# Patient Record
Sex: Female | Born: 2000 | Race: Black or African American | Hispanic: No | Marital: Single
Health system: Southern US, Community
[De-identification: ages and names within clinical notes are randomized; demographics above are authoritative.]

## PROBLEM LIST (undated history)

## (undated) DIAGNOSIS — J189 Pneumonia, unspecified organism: Secondary | ICD-10-CM

## (undated) HISTORY — PX: UMBILICAL HERNIA REPAIR: SHX196

---
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
## 2024-01-23 ENCOUNTER — Other Ambulatory Visit: Payer: Self-pay

## 2024-01-23 ENCOUNTER — Emergency Department (HOSPITAL_COMMUNITY)
Admission: EM | Admit: 2024-01-23 | Discharge: 2024-01-23 | Discharge status: Against Medical Advice | Attending: Emergency Medicine | Admitting: Emergency Medicine

## 2024-01-23 DIAGNOSIS — F419 Anxiety disorder, unspecified: Secondary | ICD-10-CM | POA: Diagnosis present

## 2024-01-23 DIAGNOSIS — Z5321 Procedure and treatment not carried out due to patient leaving prior to being seen by health care provider: Secondary | ICD-10-CM | POA: Diagnosis not present

## 2024-01-23 NOTE — ED Triage Notes (Signed)
 Patient reports anxiety with multiple emotional stressors these past several days requesting psychiatric treatment , denies SI or HI , no hallucinations or delusions .

## 2024-01-23 NOTE — ED Notes (Signed)
 Patient left the ER with family ,  states  blood work will not help her .

## 2024-01-24 ENCOUNTER — Emergency Department (EMERGENCY_DEPARTMENT_HOSPITAL)
Admission: EM | Admit: 2024-01-24 | Discharge: 2024-01-25 | Disposition: A | Discharge status: Other Acute Inpatient Hospital | Source: Home / Self Care | Attending: Emergency Medicine | Admitting: Emergency Medicine

## 2024-01-24 ENCOUNTER — Encounter (HOSPITAL_COMMUNITY): Payer: Self-pay

## 2024-01-24 DIAGNOSIS — F23 Brief psychotic disorder: Secondary | ICD-10-CM

## 2024-01-24 DIAGNOSIS — F29 Unspecified psychosis not due to a substance or known physiological condition: Secondary | ICD-10-CM | POA: Diagnosis present

## 2024-01-24 LAB — CBC
HCT: 39.4 % (ref 36.0–46.0)
Hemoglobin: 12.9 g/dL (ref 12.0–15.0)
MCH: 28.9 pg (ref 26.0–34.0)
MCHC: 32.7 g/dL (ref 30.0–36.0)
MCV: 88.3 fL (ref 80.0–100.0)
Platelets: 296 K/uL (ref 150–400)
RBC: 4.46 MIL/uL (ref 3.87–5.11)
RDW: 15 % (ref 11.5–15.5)
WBC: 5.4 K/uL (ref 4.0–10.5)
nRBC: 0 % (ref 0.0–0.2)

## 2024-01-24 MED ORDER — MIDAZOLAM HCL 2 MG/2ML IJ SOLN
2.0000 mg | Freq: Once | INTRAMUSCULAR | Status: AC
  Administered 2024-01-24: 2 mg via INTRAMUSCULAR
  Filled 2024-01-24: qty 2

## 2024-01-24 MED ORDER — OLANZAPINE 10 MG IM SOLR
5.0000 mg | Freq: Once | INTRAMUSCULAR | Status: AC
  Administered 2024-01-24: 5 mg via INTRAMUSCULAR
  Filled 2024-01-24 (×2): qty 10

## 2024-01-24 NOTE — ED Notes (Signed)
 The person with the pt reports that she is getting harder to handle and that she is saying that she is going to kill herself  we cannot draw her blood she is not co-operating  she is trying to get out the door

## 2024-01-24 NOTE — ED Notes (Signed)
 Pt family out in ED lobby yelling and cussing at security. Pt's mother came out to the lobby to speak with security and asked them to not allow any more visitors back to Ed treatment room. Family members in lobby are starting to grow increasingly irritable.
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
## 2024-01-24 NOTE — ED Notes (Signed)
IVC PAPERWORK IN PROCESS 

## 2024-01-24 NOTE — ED Notes (Signed)
 IVC PAPERWORK COMPLETE WAITING ON OFFICER TO BRING FINDINGS AND CUSTODY

## 2024-01-24 NOTE — ED Notes (Signed)
 Findings and Custody uploaded to efile.

## 2024-01-24 NOTE — ED Notes (Signed)
 IVC PAPER WORK COPIES MADE AND ATTACHED TO THE CLIP BOARD IN ORANGE ZONE. ONCOMING SECRETARY IS AWARE THAT THE FINDING AND CUSTODY  NEEDS TO BE UPLOADED  TO E-FILE I UPLOADED IT TO THE CHART

## 2024-01-24 NOTE — ED Notes (Signed)
 The pt  is not answering questions asked a female friend reports that he is the only one that can  be with her  she is attempting to walk out of the treatment room .  He keeps here from leaving he reports that her family left for the night.  He called the family to get the pts ss number for the ed chart

## 2024-01-24 NOTE — ED Notes (Signed)
 IVC CASE # [id number]

## 2024-01-25 ENCOUNTER — Encounter (HOSPITAL_COMMUNITY): Payer: Self-pay | Admitting: Psychiatry

## 2024-01-25 ENCOUNTER — Inpatient Hospital Stay (HOSPITAL_COMMUNITY)
Admission: AD | Admit: 2024-01-25 | Discharge: 2024-01-31 | DRG: 897 | Disposition: A | Discharge status: Home / Self Care | Source: Intra-hospital | Attending: Student in an Organized Health Care Education/Training Program | Admitting: Student in an Organized Health Care Education/Training Program

## 2024-01-25 ENCOUNTER — Emergency Department (HOSPITAL_COMMUNITY)

## 2024-01-25 DIAGNOSIS — Z7722 Contact with and (suspected) exposure to environmental tobacco smoke (acute) (chronic): Secondary | ICD-10-CM | POA: Diagnosis present

## 2024-01-25 DIAGNOSIS — F29 Unspecified psychosis not due to a substance or known physiological condition: Secondary | ICD-10-CM | POA: Diagnosis not present

## 2024-01-25 DIAGNOSIS — R4182 Altered mental status, unspecified: Secondary | ICD-10-CM | POA: Diagnosis present

## 2024-01-25 DIAGNOSIS — Z880 Allergy status to penicillin: Secondary | ICD-10-CM | POA: Diagnosis not present

## 2024-01-25 DIAGNOSIS — Z5982 Transportation insecurity: Secondary | ICD-10-CM | POA: Diagnosis not present

## 2024-01-25 DIAGNOSIS — F12959 Cannabis use, unspecified with psychotic disorder, unspecified: Secondary | ICD-10-CM | POA: Diagnosis present

## 2024-01-25 DIAGNOSIS — Z818 Family history of other mental and behavioral disorders: Secondary | ICD-10-CM

## 2024-01-25 DIAGNOSIS — F419 Anxiety disorder, unspecified: Secondary | ICD-10-CM | POA: Diagnosis present

## 2024-01-25 DIAGNOSIS — F19959 Other psychoactive substance use, unspecified with psychoactive substance-induced psychotic disorder, unspecified: Principal | ICD-10-CM

## 2024-01-25 DIAGNOSIS — Z62810 Personal history of physical and sexual abuse in childhood: Secondary | ICD-10-CM

## 2024-01-25 DIAGNOSIS — Z5941 Food insecurity: Secondary | ICD-10-CM | POA: Diagnosis not present

## 2024-01-25 DIAGNOSIS — Z560 Unemployment, unspecified: Secondary | ICD-10-CM | POA: Diagnosis not present

## 2024-01-25 DIAGNOSIS — F061 Catatonic disorder due to known physiological condition: Secondary | ICD-10-CM | POA: Diagnosis not present

## 2024-01-25 DIAGNOSIS — Z79899 Other long term (current) drug therapy: Secondary | ICD-10-CM | POA: Diagnosis not present

## 2024-01-25 DIAGNOSIS — F32A Depression, unspecified: Secondary | ICD-10-CM | POA: Diagnosis present

## 2024-01-25 DIAGNOSIS — F4310 Post-traumatic stress disorder, unspecified: Secondary | ICD-10-CM | POA: Diagnosis present

## 2024-01-25 LAB — COMPREHENSIVE METABOLIC PANEL WITH GFR
ALT: 15 U/L (ref 0–44)
AST: 36 U/L (ref 15–41)
Albumin: 4 g/dL (ref 3.5–5.0)
Alkaline Phosphatase: 38 U/L (ref 38–126)
Anion gap: 15 (ref 5–15)
BUN: 12 mg/dL (ref 6–20)
CO2: 20 mmol/L — ABNORMAL LOW (ref 22–32)
Calcium: 9.3 mg/dL (ref 8.9–10.3)
Chloride: 105 mmol/L (ref 98–111)
Creatinine, Ser: 1.16 mg/dL — ABNORMAL HIGH (ref 0.44–1.00)
GFR, Estimated: >60 mL/min (ref >60)
Glucose, Bld: 95 mg/dL (ref 70–99)
Potassium: 3.2 mmol/L — ABNORMAL LOW (ref 3.5–5.1)
Sodium: 140 mmol/L (ref 135–145)
Total Bilirubin: 0.8 mg/dL (ref 0.0–1.2)
Total Protein: 7.6 g/dL (ref 6.5–8.1)

## 2024-01-25 LAB — ACETAMINOPHEN LEVEL: Acetaminophen (Tylenol), Serum: <10 ug/mL — ABNORMAL LOW (ref 10–30)

## 2024-01-25 LAB — URINALYSIS, COMPLETE (UACMP) WITH MICROSCOPIC
Bilirubin Urine: NEGATIVE
Glucose, UA: NEGATIVE mg/dL
Hgb urine dipstick: NEGATIVE
Ketones, ur: 20 mg/dL — AB
Leukocytes,Ua: NEGATIVE
Nitrite: NEGATIVE
Protein, ur: 100 mg/dL — AB
Specific Gravity, Urine: 1.027 (ref 1.005–1.030)
pH: 5 (ref 5.0–8.0)

## 2024-01-25 LAB — HCG, SERUM, QUALITATIVE: Preg, Serum: NEGATIVE

## 2024-01-25 LAB — RAPID URINE DRUG SCREEN, HOSP PERFORMED
Amphetamines: NOT DETECTED
Barbiturates: NOT DETECTED
Benzodiazepines: POSITIVE — AB
Cocaine: NOT DETECTED
Opiates: NOT DETECTED
Tetrahydrocannabinol: POSITIVE — AB

## 2024-01-25 LAB — SALICYLATE LEVEL: Salicylate Lvl: <7 mg/dL — ABNORMAL LOW (ref 7.0–30.0)

## 2024-01-25 LAB — ETHANOL: Alcohol, Ethyl (B): <15 mg/dL (ref <15)

## 2024-01-25 LAB — TSH: TSH: 0.792 u[IU]/mL (ref 0.350–4.500)

## 2024-01-25 MED ORDER — POTASSIUM CHLORIDE CRYS ER 20 MEQ PO TBCR
40.0000 meq | EXTENDED_RELEASE_TABLET | Freq: Once | ORAL | Status: AC
  Administered 2024-01-25: 40 meq via ORAL
  Filled 2024-01-25: qty 2

## 2024-01-25 MED ORDER — HALOPERIDOL 5 MG PO TABS
5.0000 mg | ORAL_TABLET | Freq: Three times a day (TID) | ORAL | Status: DC | PRN
  Administered 2024-01-27: 5 mg via ORAL
  Filled 2024-01-25: qty 1

## 2024-01-25 MED ORDER — DIPHENHYDRAMINE HCL 25 MG PO CAPS
50.0000 mg | ORAL_CAPSULE | Freq: Three times a day (TID) | ORAL | Status: DC | PRN
  Administered 2024-01-27: 50 mg via ORAL
  Filled 2024-01-25: qty 2

## 2024-01-25 MED ORDER — RISPERIDONE 1 MG PO TABS
1.0000 mg | ORAL_TABLET | Freq: Every day | ORAL | Status: DC
  Administered 2024-01-25: 1 mg via ORAL
  Filled 2024-01-25: qty 1

## 2024-01-25 MED ORDER — ALUM & MAG HYDROXIDE-SIMETH 200-200-20 MG/5ML PO SUSP: 30.0000 mL | ORAL | Status: DC | PRN

## 2024-01-25 MED ORDER — RISPERIDONE 1 MG PO TABS
1.0000 mg | ORAL_TABLET | Freq: Every day | ORAL | Status: DC
  Administered 2024-01-26: 1 mg via ORAL
  Filled 2024-01-25: qty 1

## 2024-01-25 MED ORDER — HALOPERIDOL LACTATE 5 MG/ML IJ SOLN: 5.0000 mg | Freq: Three times a day (TID) | INTRAMUSCULAR | Status: DC | PRN

## 2024-01-25 MED ORDER — LORAZEPAM 2 MG/ML IJ SOLN: 2.0000 mg | Freq: Three times a day (TID) | INTRAMUSCULAR | Status: DC | PRN

## 2024-01-25 MED ORDER — MAGNESIUM HYDROXIDE 400 MG/5ML PO SUSP: 30.0000 mL | Freq: Every day | ORAL | Status: DC | PRN

## 2024-01-25 MED ORDER — DIPHENHYDRAMINE HCL 50 MG/ML IJ SOLN: 50.0000 mg | Freq: Three times a day (TID) | INTRAMUSCULAR | Status: DC | PRN

## 2024-01-25 MED ORDER — HALOPERIDOL LACTATE 5 MG/ML IJ SOLN: 10.0000 mg | Freq: Three times a day (TID) | INTRAMUSCULAR | Status: DC | PRN

## 2024-01-25 MED ORDER — ACETAMINOPHEN 325 MG PO TABS
650.0000 mg | ORAL_TABLET | Freq: Four times a day (QID) | ORAL | Status: DC | PRN
  Administered 2024-01-29 (×2): 650 mg via ORAL
  Filled 2024-01-25: qty 2

## 2024-01-25 NOTE — ED Notes (Signed)
 Non emergency [employer] notified to transport patient to [facility].

## 2024-01-25 NOTE — ED Notes (Signed)
 The pts family is still here I just found out they are in the waiting room

## 2024-01-25 NOTE — Progress Notes (Signed)
 Pt has been accepted to [facility] on 01/25/2024. Bed assignment: 506-01  Pt meets inpatient criteria per: Jerel Gravely NP  Attending Physician will be: Dr. Zouev   Report can be called to: unit: Adult unit: [phone]  Pt can arrive after 8 PM tonight   Care Team Notified: [facility] McNichol RN , Jerel Gravely NP, Lonni Mau RN  [place] Jewell Ryans LCSW-A   01/25/2024 1:48 PM

## 2024-01-25 NOTE — ED Notes (Signed)
Attempted to call [facility] no answer

## 2024-01-25 NOTE — ED Notes (Signed)
 The mother called to obtain the patient's blood test results.  I spoke with the RN on duty and he stated that the patient is requesting that  none of her information be shared with anyone. There is also a block on her patient portal.  The mother stated that she was coming to the Emergency Department. Security was notified that the patient has requested that no information be shared concerning her.  The RN on duty was notified that security has been contacted and given the request of the patient not to have her information shared.

## 2024-01-25 NOTE — ED Notes (Signed)
 PT requested family not be informed of transfer or medical updates. Asked for preferred contact to be friend [name] in chart.

## 2024-01-25 NOTE — ED Notes (Signed)
 Patient very tearful saying she do not know what's going on. Patient attempting to tell me that she been living with her uncle since July and she have not felt right since. Patient also stated she smoke marijuana prior to coming to the hospital and she felt like it was laced with something.

## 2024-01-25 NOTE — BH Assessment (Signed)
 Clinician spoke to Faith with IR[facility] to complete pt's TTS assessment. Clinician provided pt's name, MRN, location, age, room number and provider's name Secure message completed.    Iris coordinator to update secure chat when assessment time and provider are assigned.   Redmond Pulling, MS, [facility], [facility] Triage Specialist [phone]

## 2024-01-25 NOTE — ED Notes (Signed)
 Case Number: [id number] 3 copies of the IVC were verified in the purple zone.  The patient was IVC'd on 01/24/2024 and the paperwork expires on 01/31/2024.

## 2024-01-25 NOTE — ED Notes (Signed)
 The pts family residing in the waiting room  the pt appears to be sleeping

## 2024-01-25 NOTE — ED Notes (Signed)
`  the pt was brought back to room 6 not triaged family members  all over the place  apparently the pt was seen here last pm and had psy issues.  But the family took her home.  Tonight she has si but unco-operative

## 2024-01-25 NOTE — ED Notes (Signed)
 IVC is current

## 2024-01-25 NOTE — BH Assessment (Signed)
 Clinician spoke to with [facility] to complete pt's TTS assessment. Clinician provided pt's name, MRN, location, age, room number and provider's name. Secure message completed.   Iris coordinator to update secure chat when assessment time and provider are assigned.

## 2024-01-25 NOTE — ED Provider Notes (Addendum)
 Emergency Medicine Observation Re-evaluation Note  Joyce Cruz is a 23 y.o. female, seen on rounds today.  Pt initially presented to the ED for complaints of Psychiatric Evaluation Currently, the patient is sleeping.  Physical Exam  BP 117/84 (BP Location: Left Arm)   Pulse 67   Temp 97.9 F (36.6 C) (Oral)   Resp 18   SpO2 99%  Physical Exam General: resting Cardiac: regular rate Lungs: normal effort   ED Course / MDM  EKG:   I have reviewed the labs performed to date as well as medications administered while in observation.  Recent changes in the last 24 hours include initial evaluation, pt arrived in ed approx 11 hours ago.  Plan  Pt has been medically cleared.  Waiting for psych evaluation.SABRA Randol Simmonds, MD 01/25/24 [id number]  Head CT was added on as requested.  No intracranial abnormality noted.  Patient does have sinus opacification.  Would not account for ms changes.  Pt medically clear   Randol Simmonds, MD 01/25/24 1100    Randol Simmonds, MD 01/25/24 1352

## 2024-01-25 NOTE — ED Notes (Signed)
 PT transported to CT with sitter

## 2024-01-25 NOTE — BH Assessment (Signed)
 Monique, RN reports, she feels the pt may benefit from waiting til after shift change to be assessed (face to face). It has been difficult to get the pt to relax and rest. Pt has been calm and sleeping for about an hour.  Pt's consult was removed from [facility].   Jackson JONETTA Broach, MS, [facility], [facility] Triage Specialist [phone]

## 2024-01-25 NOTE — ED Notes (Signed)
 Attempted to call report per secretary patient nurse is dealing with another situation and would call me back. Left name and number with Diplomatic Services operational officer.
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
## 2024-01-25 NOTE — ED Notes (Signed)
 Pt changed into paper scrubs, all items on person inventoried and placed in locker 2.
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
## 2024-01-25 NOTE — ED Notes (Signed)
 Patient transported to [facility] via [employer]. Patient alert and oriented x4.

## 2024-01-25 NOTE — ED Notes (Signed)
Patient took shower.

## 2024-01-25 NOTE — ED Notes (Signed)
 PT has visitor, screened by security and PT talking in room with sitter present

## 2024-01-25 NOTE — ED Notes (Signed)
 IVC PAPERWORK IS CURRENT

## 2024-01-25 NOTE — Tx Team (Addendum)
 Initial Treatment Plan 01/25/2024 11:31 PM LONITA DEBES FMW:[id number]    PATIENT STRESSORS: Marital or family conflict   Occupational concerns   Substance abuse     PATIENT STRENGTHS: General fund of knowledge  Motivation for treatment/growth    PATIENT IDENTIFIED PROBLEMS: Psychosis  Disorganized thought process  I want to know what is going on, there's something you're not telling me  SA- BZO, THC  SI              DISCHARGE CRITERIA:  Adequate post-discharge living arrangements Improved stabilization in mood, thinking, and/or behavior Verbal commitment to aftercare and medication compliance  PRELIMINARY DISCHARGE PLAN: Attend PHP/IOP Attend 12-step recovery group Placement in alternative living arrangements  PATIENT/FAMILY INVOLVEMENT: This treatment plan has been presented to and reviewed with the patient, Joyce Cruz.  The patient and family have been given the opportunity to ask questions and make suggestions.  Orlando Ozell LABOR, RN 01/25/2024, 11:31 PM
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
## 2024-01-26 ENCOUNTER — Encounter (HOSPITAL_COMMUNITY): Payer: Self-pay

## 2024-01-26 ENCOUNTER — Other Ambulatory Visit: Payer: Self-pay

## 2024-01-26 ENCOUNTER — Encounter (HOSPITAL_COMMUNITY): Payer: Self-pay | Admitting: Psychiatry

## 2024-01-26 LAB — LIPID PANEL
Cholesterol: 251 mg/dL — ABNORMAL HIGH (ref 0–200)
HDL: 96 mg/dL (ref >40)
LDL Cholesterol: 145 mg/dL — ABNORMAL HIGH (ref 0–99)
Total CHOL/HDL Ratio: 2.6 ratio
Triglycerides: 49 mg/dL (ref <150)
VLDL: 10 mg/dL (ref 0–40)

## 2024-01-26 LAB — HEMOGLOBIN A1C
Hgb A1c MFr Bld: 5.2 % (ref 4.8–5.6)
Mean Plasma Glucose: 103 mg/dL

## 2024-01-26 MED ORDER — TRAZODONE HCL 50 MG PO TABS
50.0000 mg | ORAL_TABLET | Freq: Every evening | ORAL | Status: DC | PRN
  Administered 2024-01-26 – 2024-01-30 (×7): 50 mg via ORAL
  Filled 2024-01-26 (×5): qty 1

## 2024-01-26 MED ORDER — HYDROXYZINE HCL 25 MG PO TABS
25.0000 mg | ORAL_TABLET | Freq: Three times a day (TID) | ORAL | Status: AC | PRN
  Administered 2024-01-26: 25 mg via ORAL
  Filled 2024-01-26: qty 1

## 2024-01-26 MED ORDER — RISPERIDONE 1 MG PO TABS
1.0000 mg | ORAL_TABLET | Freq: Two times a day (BID) | ORAL | Status: DC
  Administered 2024-01-26 – 2024-01-27 (×2): 1 mg via ORAL
  Filled 2024-01-26 (×2): qty 1

## 2024-01-26 NOTE — Plan of Care (Signed)
  Problem: Education: Goal: Emotional status will improve Outcome: Not Progressing Goal: Mental status will improve Outcome: Not Progressing    Dar  Note: Patient presents flat, anxious, guarded with depressed mood.  Preoccupied with getting discharge.  Patient appears tearful and sad.  Doesn't know why she's here and demanding to see her provider.  Denies suicidal thoughts, auditory and visual hallucinations.  Medication given as prescribed.  Routine safety checks maintained.  Offered support and encouragement as needed.  Staff continues to explain to patient treatment plan of care but lacks understanding or insight.  Patient is safe on the unit.

## 2024-01-26 NOTE — Progress Notes (Signed)
(  Sleep Hours) -8.25  (Any PRNs that were needed, meds refused, or side effects to meds)- Prn Vistaril  25 mg, Trazodone  50 mg  (Any disturbances and when (visitation, over night)- none  (Concerns raised by the patient)- pt continues to wonder why she is here, pt continues to be confused at times  (SI/HI/AVH)- denies

## 2024-01-26 NOTE — Group Note (Signed)
 Recreation Therapy Group Note   Group Topic:Problem Solving  Group Date: 01/26/2024 Start Time: 1015 End Time: 1045 Facilitators: Madline Oesterling-McCall, LRT,CTRS Location: [address] Dayroom   Group Topic: Problem Solving  Goal Area(s) Addresses:  Patient will identify positive ways to complete puzzles presented.  Patient will work effectively with peer to solve the puzzles presented in group.  Behavioral Response:    Intervention: Group Work, Problem Solving  Activity: In pairs, patients were asked to create a game with their teammate. Team's were tasked with designing a game, including a Name, Description of Game, Equipment/Supplies, Rules, and Number of players needed. Teach back used to present their game idea to the group.  Education:  Leisure Scientist, physiological, Special educational needs teacher, Teamwork, Discharge Planning  Education Outcome: Acknowledges education/In group clarification offered/Needs additional education.    Affect/Mood: Labile   Participation Level: None   Participation Quality: None   Behavior: Guarded   Speech/Thought Process: Barely audible    Insight: None   Judgement: Nonoe   Modes of Intervention: Worksheet   Patient Response to Interventions:  None   Education Outcome:  In group clarification offered    Clinical Observations/Individualized Feedback: Pt would come into group and walk up on LRT asking to talk. LRT informed pt that LRT was in the middle of group and would have to talk to her after. Pt left but would later return.     Plan: Continue to engage patient in RT group sessions 2-3x/week.   Galvin Aversa-McCall, LRT,CTRS 01/26/2024 11:33 AM
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
## 2024-01-26 NOTE — Plan of Care (Signed)
  Problem: Activity: Goal: Sleeping patterns will improve Outcome: Progressing   Problem: Safety: Goal: Periods of time without injury will increase Outcome: Progressing   Problem: Education: Goal: Emotional status will improve Outcome: Not Progressing Goal: Mental status will improve Outcome: Not Progressing   Problem: Education: Goal: Mental status will improve Outcome: Not Progressing
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
## 2024-01-26 NOTE — [facility] Group Notes (Addendum)
 Spirituality Group   Description: Participant directed exploration of values, beliefs and meaning   Following a brief framework of chaplain's role and ground rules of group behavior, participants are invited to share concerns or questions that engage spiritual life. Emphasis placed on common themes and shared experiences and ways to make meaning and clarify living into one's values.   Theory/Process/Goal: Utilize the theoretical framework of group therapy established by Celena Kite, Relational Cultural Theory and Rogerian approaches to facilitate relational empathy and use of the "here and now" to foster reflection, self-awareness, and sharing.   Observations: Joyce Cruz was present for the second half of group. She was an active participant in the group discussion. She was rather emotional, sharing with openness around concerns with family relationships. This invited group discussion around healthy boundaries and sometimes locating chosen family, people we can trust, ways to look elsewhere for supportive relationships.  Joyce Cruz, M.Div [phone]

## 2024-01-26 NOTE — Plan of Care (Signed)
  Problem: Activity: Goal: Sleeping patterns will improve Outcome: Progressing   Problem: Safety: Goal: Periods of time without injury will increase Outcome: Progressing   Problem: Education: Goal: Emotional status will improve Outcome: Not Progressing Goal: Mental status will improve Outcome: Not Progressing

## 2024-01-26 NOTE — Group Note (Signed)
 Date:  01/26/2024 Time:  8:44 PM  Group Topic/Focus:  Wrap-Up Group:   The focus of this group is to help patients review their daily goal of treatment and discuss progress on daily workbooks.    Participation Level:  Active  Participation Quality:  Appropriate and Sharing  Affect:  Appropriate and Flat  Cognitive:  Appropriate  Insight: Appropriate and Limited  Engagement in Group:  Engaged  Modes of Intervention:  Discussion and Socialization  Additional Comments:  Patient shared that she is doing fine and she had an okay day. Patient stated that she is trying to figure out why she is still here. Patient rated her day a 5 out of 10.  Eward Mace 01/26/2024, 8:44 PM

## 2024-01-26 NOTE — Progress Notes (Signed)
(  Sleep Hours) - 5  (Any PRNs that were needed, meds refused, or side effects to meds)- N/A  (Any disturbances and when (visitation, over night)- N/A  (Concerns raised by the patient)- why am I here  (SI/HI/AVH)- denies

## 2024-01-26 NOTE — Progress Notes (Signed)
   01/26/24 1100  Psych Admission Type (Psych Patients Only)  Admission Status Involuntary  Psychosocial Assessment  Patient Complaints Sadness;Depression  Eye Contact Fair  Facial Expression Sad  Affect Preoccupied;Sad  Speech Slow  Interaction Cautious  Motor Activity Slow  Appearance/Hygiene In scrubs  Behavior Characteristics Guarded  Mood Preoccupied  Thought Process  Coherency Blocking;Circumstantial  Content Preoccupation  Delusions Paranoid  Perception Derealization  Hallucination None reported or observed  Judgment Impaired  Confusion None  Danger to Self  Current suicidal ideation? Denies  Danger to Others  Danger to Others None reported or observed
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
## 2024-01-26 NOTE — BHH Group Notes (Signed)
 Adult Psychoeducational Group Note  Date:  01/26/2024 Time:  9:13 AM  Group Topic/Focus:  Goals Group:   The focus of this group is to help patients establish daily goals to achieve during treatment and discuss how the patient can incorporate goal setting into their daily lives to aide in recovery. Orientation:   The focus of this group is to educate the patient on the purpose and policies of crisis stabilization and provide a format to answer questions about their admission.  The group details unit policies and expectations of patients while admitted.  Participation Level:  Active  Participation Quality:  Appropriate  Affect:  Appropriate  Cognitive:  Appropriate  Insight: Appropriate  Engagement in Group:  Engaged  Modes of Intervention:  Discussion  Additional Comments:  Pt attended the goals group and remained appropriate and engaged throughout the duration of the group.   Kapena Hamme O 01/26/2024, 9:13 AM

## 2024-01-26 NOTE — BH IP Treatment Plan (Signed)
 Interdisciplinary Treatment and Diagnostic Plan Update  01/26/2024 Time of Session: 11:15 AM Joyce Cruz MRN: [id number]  Principal Diagnosis: Psychosis, unspecified psychosis type (HCC)  Secondary Diagnoses: Principal Problem:   Psychosis, unspecified psychosis type (HCC)   Current Medications:  Current Facility-Administered Medications  Medication Dose Route Frequency Provider Last Rate Last Admin   acetaminophen  (TYLENOL ) tablet 650 mg  650 mg Oral Q6H PRN Mannie Jerel PARAS, NP       alum & mag hydroxide-simeth (MAALOX/MYLANTA) 200-200-20 MG/5ML suspension 30 mL  30 mL Oral Q4H PRN Mannie Jerel PARAS, NP       haloperidol  (HALDOL ) tablet 5 mg  5 mg Oral TID PRN Mannie Jerel PARAS, NP       And   diphenhydrAMINE  (BENADRYL ) capsule 50 mg  50 mg Oral TID PRN Mannie Jerel PARAS, NP       haloperidol  lactate (HALDOL ) injection 5 mg  5 mg Intramuscular TID PRN Mannie Jerel PARAS, NP       And   diphenhydrAMINE  (BENADRYL ) injection 50 mg  50 mg Intramuscular TID PRN Mannie Jerel PARAS, NP       And   LORazepam  (ATIVAN ) injection 2 mg  2 mg Intramuscular TID PRN Mannie Jerel PARAS, NP       haloperidol  lactate (HALDOL ) injection 10 mg  10 mg Intramuscular TID PRN Mannie Jerel PARAS, NP       And   diphenhydrAMINE  (BENADRYL ) injection 50 mg  50 mg Intramuscular TID PRN Mannie Jerel PARAS, NP       And   LORazepam  (ATIVAN ) injection 2 mg  2 mg Intramuscular TID PRN Mannie Jerel PARAS, NP       magnesium  hydroxide (MILK OF MAGNESIA) suspension 30 mL  30 mL Oral Daily PRN Mannie Jerel PARAS, NP       risperiDONE  (RISPERDAL ) tablet 1 mg  1 mg Oral Q12H Zouev, Dmitri, MD       PTA Medications: No medications prior to admission.    Patient Stressors: Marital or family conflict   Occupational concerns   Substance abuse    Patient Strengths: Automotive engineer for treatment/growth   Treatment Modalities: Medication Management, Group therapy, Case management,  1 to 1 session with  clinician, Psychoeducation, Recreational therapy.   Physician Treatment Plan for Primary Diagnosis: Psychosis, unspecified psychosis type (HCC) Long Term Goal(s): Improvement in symptoms so as ready for discharge   Short Term Goals: Ability to verbalize feelings will improve Ability to identify and develop effective coping behaviors will improve Ability to maintain clinical measurements within normal limits will improve Ability to identify triggers associated with substance abuse/mental health issues will improve  Medication Management: Evaluate patient's response, side effects, and tolerance of medication regimen.  Therapeutic Interventions: 1 to 1 sessions, Unit Group sessions and Medication administration.  Evaluation of Outcomes: Not Progressing  Physician Treatment Plan for Secondary Diagnosis: Principal Problem:   Psychosis, unspecified psychosis type (HCC)  Long Term Goal(s): Improvement in symptoms so as ready for discharge   Short Term Goals: Ability to verbalize feelings will improve Ability to identify and develop effective coping behaviors will improve Ability to maintain clinical measurements within normal limits will improve Ability to identify triggers associated with substance abuse/mental health issues will improve     Medication Management: Evaluate patient's response, side effects, and tolerance of medication regimen.  Therapeutic Interventions: 1 to 1 sessions, Unit Group sessions and Medication administration.  Evaluation of Outcomes: Not Progressing   RN Treatment  Plan for Primary Diagnosis: Psychosis, unspecified psychosis type (HCC) Long Term Goal(s): Knowledge of disease and therapeutic regimen to maintain health will improve  Short Term Goals: Ability to remain free from injury will improve, Ability to verbalize frustration and anger appropriately will improve, Ability to demonstrate self-control, Ability to participate in decision making will improve,  Ability to verbalize feelings will improve, Ability to disclose and discuss suicidal ideas, Ability to identify and develop effective coping behaviors will improve, and Compliance with prescribed medications will improve  Medication Management: RN will administer medications as ordered by provider, will assess and evaluate patient's response and provide education to patient for prescribed medication. RN will report any adverse and/or side effects to prescribing provider.  Therapeutic Interventions: 1 on 1 counseling sessions, Psychoeducation, Medication administration, Evaluate responses to treatment, Monitor vital signs and CBGs as ordered, Perform/monitor CIWA, COWS, AIMS and Fall Risk screenings as ordered, Perform wound care treatments as ordered.  Evaluation of Outcomes: Not Progressing   LCSW Treatment Plan for Primary Diagnosis: Psychosis, unspecified psychosis type (HCC) Long Term Goal(s): Safe transition to appropriate next level of care at discharge, Engage patient in therapeutic group addressing interpersonal concerns.  Short Term Goals: Engage patient in aftercare planning with referrals and resources, Increase social support, Increase ability to appropriately verbalize feelings, Increase emotional regulation, Facilitate acceptance of mental health diagnosis and concerns, Facilitate patient progression through stages of change regarding substance use diagnoses and concerns, Identify triggers associated with mental health/substance abuse issues, and Increase skills for wellness and recovery  Therapeutic Interventions: Assess for all discharge needs, 1 to 1 time with Social worker, Explore available resources and support systems, Assess for adequacy in community support network, Educate family and significant other(s) on suicide prevention, Complete Psychosocial Assessment, Interpersonal group therapy.  Evaluation of Outcomes: Not Progressing   Progress in Treatment: Attending groups:  Yes. Participating in groups: Yes. Taking medication as prescribed: Yes. Toleration medication: Yes. Family/Significant other contact made: No, will contact:  [name] (ex-boyfriend) [phone] Patient understands diagnosis: No. Discussing patient identified problems/goals with staff: No. Medical problems stabilized or resolved: Yes. Denies suicidal/homicidal ideation: Yes. Issues/concerns per patient self-inventory: No.   New problem(s) identified:  No  New Short Term/Long Term Goal(s):    medication stabilization, elimination of SI thoughts, development of comprehensive mental wellness plan.    Patient Goals:  I smoked with my cousin but I'm feeling fine now.  I want to leave the hospital.  Discharge Plan or Barriers:  Patient recently admitted. CSW will continue to follow and assess for appropriate referrals and possible discharge planning.   Reason for Continuation of Hospitalization: Medication stabilization Psychosis  Estimated Length of Stay:  5 - 7 days  Last 3 [place] Suicide Severity Risk Score: Flowsheet Row [facility] (Current[facility]23/2024 in [facility] at [facility]  C-SSRS RISK CATEGORY No Risk No Risk    Last PHQ 2/9 Scores:     No data to display          Scribe for Treatment Team: Margaruite Top O Traquan Duarte, LCSWA 01/26/2024 5:03 PM

## 2024-01-27 LAB — COMPREHENSIVE METABOLIC PANEL WITH GFR
ALT: 16 U/L (ref 0–44)
AST: 27 U/L (ref 15–41)
Albumin: 3.7 g/dL (ref 3.5–5.0)
Alkaline Phosphatase: 39 U/L (ref 38–126)
Anion gap: 11 (ref 5–15)
BUN: 10 mg/dL (ref 6–20)
CO2: 25 mmol/L (ref 22–32)
Calcium: 9.6 mg/dL (ref 8.9–10.3)
Chloride: 104 mmol/L (ref 98–111)
Creatinine, Ser: 1.04 mg/dL — ABNORMAL HIGH (ref 0.44–1.00)
GFR, Estimated: >60 mL/min (ref >60)
Glucose, Bld: 88 mg/dL (ref 70–99)
Potassium: 3.4 mmol/L — ABNORMAL LOW (ref 3.5–5.1)
Sodium: 140 mmol/L (ref 135–145)
Total Bilirubin: 0.9 mg/dL (ref 0.0–1.2)
Total Protein: 7.8 g/dL (ref 6.5–8.1)

## 2024-01-27 LAB — RPR: RPR Ser Ql: NONREACTIVE

## 2024-01-27 LAB — VITAMIN B12: Vitamin B-12: 620 pg/mL (ref 180–914)

## 2024-01-27 LAB — HIV ANTIBODY (ROUTINE TESTING W REFLEX): HIV Screen 4th Generation wRfx: NONREACTIVE

## 2024-01-27 LAB — FOLATE: Folate: 12.4 ng/mL (ref >5.9)

## 2024-01-27 MED ORDER — BENZTROPINE MESYLATE 1 MG/ML IJ SOLN: 2.0000 mg | Freq: Three times a day (TID) | INTRAMUSCULAR | Status: DC | PRN

## 2024-01-27 MED ORDER — RISPERIDONE 1 MG PO TABS
1.0000 mg | ORAL_TABLET | Freq: Every day | ORAL | Status: DC
  Administered 2024-01-28: 1 mg via ORAL
  Filled 2024-01-27: qty 1

## 2024-01-27 MED ORDER — RISPERIDONE 2 MG PO TABS
2.0000 mg | ORAL_TABLET | Freq: Every day | ORAL | Status: DC
  Administered 2024-01-27: 2 mg via ORAL
  Filled 2024-01-27: qty 1

## 2024-01-27 NOTE — Progress Notes (Addendum)
 Pt presented with heighten anxiety at intervals during the day, appearsd tearful and frighten, requesting to talk with various staff and when honored to sit and talk she would walk away. Pt appeared to be entertaining interval stimuli but denied AVH. Pt was gein the agitation protocol medication around noon and she rested quietly most of the afternoon.     01/27/24 1100  Psych Admission Type (Psych Patients Only)  Admission Status Involuntary  Psychosocial Assessment  Patient Complaints Anxiety;Irritability  Eye Contact Brief  Facial Expression Sad  Affect Sad;Preoccupied  Speech Logical/coherent  Interaction Assertive  Motor Activity Slow (WDL)  Appearance/Hygiene Unremarkable  Behavior Characteristics Guarded  Mood Depressed  Thought Process  Coherency Circumstantial  Content Preoccupation  Delusions None reported or observed  Perception WDL  Hallucination None reported or observed  Judgment Impaired  Confusion None  Danger to Self  Current suicidal ideation? Denies  Danger to Others  Danger to Others None reported or observed

## 2024-01-27 NOTE — Group Note (Signed)
 Date:  01/27/2024 Time:  8:30 PM  Group Topic/Focus:  Wrap-Up Group:   The focus of this group is to help patients review their daily goal of treatment and discuss progress on daily workbooks.    Participation Level:  Did Not Attend   Angenette Daily Dacosta 01/27/2024, 8:30 PM

## 2024-01-27 NOTE — Group Note (Signed)
 LCSW Group Therapy Note  Group Date: 01/27/2024 Start Time: 1300 End Time: 1400   Type of Therapy and Topic:  Group Therapy Positive Self-Talk and Healthy Coping Skills. Participation Level:  Active   Description of Group The focus of this group was to determine what unhealthy coping techniques typically are used by group members and what healthy coping techniques would be helpful in coping with various problems. Patients were guided in becoming aware of the differences between healthy and unhealthy coping techniques. Patients were asked to identify 2-3 healthy coping skills they would like to learn to use more effectively.  Therapeutic Goals Patients learned that coping is what human beings do all day long to deal with various situations in their lives Patients defined and discussed healthy vs unhealthy coping techniques Patients identified their preferred coping techniques and identified whether these were healthy or unhealthy Patients determined 2-3 healthy coping skills they would like to become more familiar with and use more often. Patients provided support and ideas to each other   Summary of Patient Progress:  During group, Joyce Cruz expressed gratefulness for life with faith in God, positive talk, and surrounding herself around positive people as healthy coping skills. Patient proved open to input from peers and feedback from CSW. Patient demonstrated good insight into the subject matter, was respectful of peers, and participated throughout the entire session.   Therapeutic Modalities Cognitive Behavioral Therapy Motivational Interviewing  Hunter JONELLE Lever, LCSWA 01/27/2024  3:00 PM
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
## 2024-01-27 NOTE — Plan of Care (Signed)
  Problem: Education: Goal: Emotional status will improve Outcome: Not Met (add Reason) Goal: Mental status will improve Outcome: Not Met (add Reason)   Problem: Activity: Goal: Sleeping patterns will improve Outcome: Progressing   Problem: Coping: Goal: Ability to verbalize frustrations and anger appropriately will improve Outcome: Not Met (add Reason)   Problem: Activity: Goal: Interest or engagement in activities will improve Outcome: Not Met (add Reason)   Problem: Coping: Goal: Ability to verbalize frustrations and anger appropriately will improve Outcome: Not Met (add Reason) Goal: Ability to demonstrate self-control will improve Outcome: Progressing   Problem: Coping: Goal: Coping ability will improve Outcome: Progressing

## 2024-01-28 MED ORDER — RISPERIDONE 1 MG PO TABS
1.0000 mg | ORAL_TABLET | Freq: Two times a day (BID) | ORAL | Status: DC
  Administered 2024-01-28 – 2024-01-29 (×5): 1 mg via ORAL
  Filled 2024-01-28 (×4): qty 1

## 2024-01-28 MED ORDER — LORAZEPAM 1 MG PO TABS: 1.0000 mg | ORAL_TABLET | Freq: Three times a day (TID) | ORAL | Status: DC

## 2024-01-28 MED ORDER — LORAZEPAM 1 MG PO TABS
1.0000 mg | ORAL_TABLET | Freq: Once | ORAL | Status: AC
  Administered 2024-01-28: 1 mg via ORAL
  Filled 2024-01-28: qty 1

## 2024-01-28 MED ORDER — LORAZEPAM 1 MG PO TABS
1.0000 mg | ORAL_TABLET | Freq: Three times a day (TID) | ORAL | Status: DC
Start: 2024-01-28 — End: 2024-01-28

## 2024-01-28 MED ORDER — LORAZEPAM 1 MG PO TABS
1.0000 mg | ORAL_TABLET | Freq: Three times a day (TID) | ORAL | Status: DC
  Administered 2024-01-28 – 2024-01-29 (×3): 1 mg via ORAL
  Filled 2024-01-28 (×2): qty 1

## 2024-01-28 NOTE — Plan of Care (Signed)
  Problem: Education: Goal: Emotional status will improve 01/28/2024 0037 by Owen Czar, RN Outcome: Progressing 01/28/2024 0035 by Owen Czar, RN Outcome: Progressing   Problem: Education: Goal: Mental status will improve 01/28/2024 0037 by Owen Czar, RN Outcome: Progressing 01/28/2024 0035 by Owen Czar, RN Outcome: Progressing   Problem: Activity: Goal: Interest or engagement in activities will improve 01/28/2024 0037 by Owen Czar, RN Outcome: Progressing 01/28/2024 0035 by Owen Czar, RN Outcome: Progressing   Problem: Activity: Goal: Sleeping patterns will improve 01/28/2024 0037 by Owen Czar, RN Outcome: Progressing 01/28/2024 0035 by Owen Czar, RN Outcome: Progressing

## 2024-01-28 NOTE — Group Note (Signed)
 Date:  01/28/2024 Time:  8:29 PM  Group Topic/Focus:  Wrap-Up Group:   The focus of this group is to help patients review their daily goal of treatment and discuss progress on daily workbooks.    Participation Level:  Active  Participation Quality:  Appropriate  Affect:  Appropriate  Cognitive:  Appropriate  Insight: Appropriate  Engagement in Group:  Engaged  Modes of Intervention:  Education and Exploration  Additional Comments:  Patient attended and participated in group tonight.  She reports that the best port of her day was seeing her mother who visited with her today.  She could not say how the visit went because her mother and herself was in the unknown.  Joyce Cruz 01/28/2024, 8:29 PM

## 2024-01-28 NOTE — Progress Notes (Signed)
(  Sleep Hours) -9  (Any PRNs that were needed, meds refused, or side effects to meds)- Trazodone  50 mg  (Any disturbances and when (visitation, over night)-N/A  (Concerns raised by the patient)- when I'm leaving  pt informed to talk to the doctor , pt stated I was stuck earlier , pt encouraged to be more vocal with the doctor tomorrow , pt stated she would.   (SI/HI/AVH)- denies

## 2024-01-28 NOTE — Progress Notes (Deleted)
   01/28/24 0000  Psych Admission Type (Psych Patients Only)  Admission Status Involuntary  Psychosocial Assessment  Patient Complaints Suspiciousness;Depression;Anxiety  Eye Contact Brief  Facial Expression Sad  Affect Sad;Preoccupied  Speech Logical/coherent  Interaction Assertive  Motor Activity Slow  Appearance/Hygiene Unremarkable  Behavior Characteristics Guarded  Mood Depressed  Aggressive Behavior  Effect No apparent injury  Thought Process  Coherency Blocking  Content Preoccupation  Delusions None reported or observed  Perception WDL  Hallucination None reported or observed  Judgment Impaired  Confusion None  Danger to Self  Current suicidal ideation? Denies  Danger to Others  Danger to Others None reported or observed

## 2024-01-28 NOTE — Progress Notes (Signed)
(  Sleep Hours) -8.25  (Any PRNs that were needed, meds refused, or side effects to meds)- Trazodone   (Any disturbances and when (visitation, over night)-none  (Concerns raised by the patient)- none  (SI/HI/AVH)- Denies

## 2024-01-28 NOTE — Plan of Care (Signed)
   Problem: Education: Goal: Emotional status will improve Outcome: Progressing Goal: Mental status will improve Outcome: Progressing   Problem: Activity: Goal: Sleeping patterns will improve Outcome: Progressing   Problem: Safety: Goal: Periods of time without injury will increase Outcome: Progressing

## 2024-01-28 NOTE — Progress Notes (Signed)
   01/28/24 0000  Psych Admission Type (Psych Patients Only)  Admission Status Involuntary  Psychosocial Assessment  Patient Complaints Suspiciousness;Depression;Anxiety  Eye Contact Brief  Facial Expression Sad  Affect Sad;Preoccupied  Speech Logical/coherent  Interaction Assertive  Motor Activity Slow  Appearance/Hygiene Unremarkable  Behavior Characteristics Guarded  Mood Depressed  Aggressive Behavior  Effect No apparent injury  Thought Process  Coherency Blocking  Content Preoccupation  Delusions None reported or observed  Perception WDL  Hallucination None reported or observed  Judgment Impaired  Confusion None  Danger to Self  Current suicidal ideation? Denies  Danger to Others  Danger to Others None reported or observed

## 2024-01-28 NOTE — Progress Notes (Signed)
   01/28/24 0921  Psych Admission Type (Psych Patients Only)  Admission Status Involuntary  Psychosocial Assessment  Patient Complaints Anxiety;Depression  Eye Contact Brief  Facial Expression Sad  Affect Sad;Preoccupied  Speech Logical/coherent  Interaction Assertive  Motor Activity Slow  Appearance/Hygiene Unremarkable  Behavior Characteristics Guarded  Mood Depressed  Thought Process  Coherency Blocking  Content Preoccupation  Delusions None reported or observed  Perception WDL  Hallucination None reported or observed  Judgment Impaired  Confusion None  Danger to Self  Current suicidal ideation? Denies  Danger to Others  Danger to Others None reported or observed
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
## 2024-01-29 DIAGNOSIS — F29 Unspecified psychosis not due to a substance or known physiological condition: Secondary | ICD-10-CM

## 2024-01-29 MED ORDER — OXYMETAZOLINE HCL 0.05 % NA SOLN
1.0000 | Freq: Two times a day (BID) | NASAL | Status: DC | PRN
  Administered 2024-01-30 – 2024-01-31 (×4): 1 via NASAL
  Filled 2024-01-29: qty 30

## 2024-01-29 NOTE — Group Note (Signed)
 Date:  01/29/2024 Time:  8:37 PM  Group Topic/Focus:  Wrap-Up Group:   The focus of this group is to help patients review their daily goal of treatment and discuss progress on daily workbooks.    Participation Level:  Active  Participation Quality:  Appropriate  Affect:  Appropriate  Cognitive:  Appropriate  Insight: Appropriate  Engagement in Group:  Developing/Improving  Modes of Intervention:  Discussion  Additional Comments:  Pt stated her goal for today was to focus on her treatment plan. Pt stated she accomplished her goal today. Pt stated she spoke with her doctor and with her social worker about her care today. Pt rated her overall day a 10. Pt stated she was able to contact her parents today and her boyfriend coming for visitation tonight  improved her overall day. Pt stated she felt better about herself tonight. Pt stated she was able to attend all meals today. Pt stated she took all medications provided today. Pt stated her appetite was pretty good today. Pt rated her sleep last night was pretty good. Pt stated the goal tonight was to get some rest. Pt stated she had no physical pain tonight. Pt deny visual hallucinations and auditory issues tonight. Pt denies thoughts of harming herself or others. Pt stated she would alert staff if anything changed.   Lonni Na 01/29/2024, 8:37 PM
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
## 2024-01-29 NOTE — Plan of Care (Signed)
  Problem: Education: Goal: Emotional status will improve Outcome: Progressing Goal: Mental status will improve Outcome: Progressing   Problem: Activity: Goal: Interest or engagement in activities will improve Outcome: Progressing   Problem: Education: Goal: Emotional status will improve Outcome: Progressing

## 2024-01-29 NOTE — Group Note (Signed)
 LCSW Group Therapy Note   Group Date: 01/29/2024 Start Time: 1300 End Time: 1400   Participation:  patient was present and actively participated in the discussion  Type of Therapy:  Group Therapy  Topic:  Lifestyle:  From "One Day" to "Today is Day One"  Objective:  To promote mental and physical well-being through lifestyle changes in routine, nutrition, sleep, and movement.  Goals: Increase awareness of how lifestyle habits impact mental health. Encourage one small, achievable wellness goal. Support group sharing and accountability.  Summary:  Group members explored how daily habits influence mental health and discussed the importance of starting with small, manageable changes. Participants identified personal goals and shared reflections on improving structure, sleep, diet, and physical activity.  Therapeutic Modalities: CBT - Identifying and challenging all-or-nothing thinking; promoting realistic, helpful thoughts about change. Psychoeducation - Teaching about the impact of sleep, nutrition, movement, and routine on mental health. Motivational Interviewing - Eliciting personal motivation and exploring readiness for change. Goal-Setting - Supporting SMART goals to build self-efficacy and encourage follow-through.   Joyce Cruz O Carole Deere, LCSWA 01/29/2024  5:01 PM

## 2024-01-29 NOTE — Progress Notes (Signed)
   01/29/24 0825  Psych Admission Type (Psych Patients Only)  Admission Status Involuntary  Psychosocial Assessment  Patient Complaints Anxiety  Eye Contact Staring  Facial Expression Sad  Affect Sad;Depressed  Speech Logical/coherent  Interaction Assertive  Motor Activity Slow  Appearance/Hygiene Unremarkable  Behavior Characteristics Guarded  Mood Depressed  Thought Process  Coherency Blocking  Content Preoccupation  Delusions None reported or observed  Perception WDL  Hallucination None reported or observed  Judgment Poor  Confusion None  Danger to Self  Current suicidal ideation? Denies  Danger to Others  Danger to Others None reported or observed

## 2024-01-29 NOTE — Progress Notes (Signed)
(  Sleep Hours) -   (Any PRNs that were needed, meds refused, or side effects to meds)- PRN Trazodone  50 mg , Afrin-per  MAR  (Any disturbances and when (visitation, over night)-N/A  (Concerns raised by the patient)- when she will D/C, nasal congestion  (SI/HI/AVH)- denies

## 2024-01-30 MED ORDER — FLUTICASONE PROPIONATE 50 MCG/ACT NA SUSP
1.0000 | Freq: Every day | NASAL | Status: DC | PRN
  Administered 2024-01-30 (×2): 1 via NASAL
  Filled 2024-01-30: qty 16

## 2024-01-30 MED ORDER — RISPERIDONE 1 MG PO TABS
1.0000 mg | ORAL_TABLET | Freq: Every day | ORAL | Status: DC
  Administered 2024-01-30 (×2): 1 mg via ORAL

## 2024-01-30 NOTE — Group Note (Unsigned)
 Date:  01/30/2024 Time:  7:49 PM  Group Topic/Focus:  Wrap-Up Group:   The focus of this group is to help patients review their daily goal of treatment and discuss progress on daily workbooks.     Participation Level:  {BHH PARTICIPATION OZCZO:77735}  Participation Quality:  {BHH PARTICIPATION QUALITY:22265}  Affect:  {BHH AFFECT:22266}  Cognitive:  {BHH COGNITIVE:22267}  Insight: {BHH Insight2:20797}  Engagement in Group:  {BHH ENGAGEMENT IN HMNLE:77731}  Modes of Intervention:  {BHH MODES OF INTERVENTION:22269}  Additional Comments:  ***  Gwenn Nobie [place] 01/30/2024, 7:49 PM

## 2024-01-30 NOTE — Group Note (Signed)
 Date:  01/30/2024 Time:  8:39 PM  Group Topic/Focus:  Wrap-Up Group:   The focus of this group is to help patients review their daily goal of treatment and discuss progress on daily workbooks.    Participation Level:  Active  Participation Quality:  Appropriate  Affect:  Appropriate  Cognitive:  Appropriate  Insight: Appropriate  Engagement in Group:  Engaged  Modes of Intervention:  Education and Exploration  Additional Comments:  Patient attended and participated in group tonight.  She reports that her goal was to speak with her doctor.  What she has learnt since being here is re-enforcing what she already know.  She feels that her situation is getting better.  Joyce Cruz 01/30/2024, 8:39 PM
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
## 2024-01-30 NOTE — [facility] Suicide Risk Assessment (Addendum)
 [facility] INPATIENT:  Family/Significant Other Suicide Prevention Education  Suicide Prevention Education:  Education Completed; Joyce Cruz (friend) [phone],  (name of family member/significant other) has been identified by the patient as the family member/significant other with whom the patient will be residing, and identified as the person(s) who will aid the patient in the event of a mental health crisis (suicidal ideations/suicide attempt).  With written consent from the patient, the family member/significant other has been provided the following suicide prevention education, prior to the and/or following the discharge of the patient.  Friend said patient doesn't have firearms.  She doesn't have any concerns about patient being discharged.  The suicide prevention education provided includes the following: Suicide risk factors Suicide prevention and interventions National Suicide Hotline telephone number [facility] assessment telephone number [facility] Emergency Assistance 911 [facility] and/or Residential Mobile Crisis Unit telephone number  Request made of family/significant other to: Remove weapons (e.g., guns, rifles, knives), all items previously/currently identified as safety concern.    The family member/significant other verbalizes understanding of the suicide prevention education information provided.  The family member/significant other agrees to remove the items of safety concern listed above.  Joyce Cruz, LCSWA 01/30/2024, 5:46 PM

## 2024-01-30 NOTE — [facility] Suicide Risk Assessment (Signed)
 [facility] INPATIENT:  Family/Significant Other Suicide Prevention Education  Suicide Prevention Education:  Education Completed; Joyce Cruz (ex-boyfriend) [phone],  (name of family member/significant other) has been identified by the patient as the family member/significant other with whom the patient will be residing, and identified as the person(s) who will aid the patient in the event of a mental health crisis (suicidal ideations/suicide attempt).  With written consent from the patient, the family member/significant other has been provided the following suicide prevention education, prior to the and/or following the discharge of the patient.  Ex-boyfriend said that he will pick up patient on Wednesday, 8/13 at 4 PM (after work).  Patient will come to his home.  He said there are no guns or weapons, patient doesn't have access to guns or weapons.  He didn't have any safety concerns about patient being discharged.  The suicide prevention education provided includes the following: Suicide risk factors Suicide prevention and interventions National Suicide Hotline telephone number [facility] assessment telephone number [facility] Emergency Assistance 911 [facility] and/or Residential Mobile Crisis Unit telephone number  Request made of family/significant other to: Remove weapons (e.g., guns, rifles, knives), all items previously/currently identified as safety concern.   Remove drugs/medications (over-the-counter, prescriptions, illicit drugs), all items previously/currently identified as a safety concern.  The family member/significant other verbalizes understanding of the suicide prevention education information provided.  The family member/significant other agrees to remove the items of safety concern listed above.  Joyce Cruz, LCSWA 01/30/2024, 5:49 PM

## 2024-01-30 NOTE — Plan of Care (Signed)
   Problem: Education: Goal: Emotional status will improve Outcome: Progressing Goal: Mental status will improve Outcome: Progressing   Problem: Activity: Goal: Sleeping patterns will improve Outcome: Progressing   Problem: Safety: Goal: Periods of time without injury will increase Outcome: Progressing

## 2024-01-30 NOTE — Plan of Care (Signed)
   Problem: Education: Goal: Emotional status will improve Outcome: Progressing Goal: Mental status will improve Outcome: Progressing Goal: Verbalization of understanding the information provided will improve Outcome: Progressing

## 2024-01-30 NOTE — Progress Notes (Signed)
(  Sleep Hours) -7.25  (Any PRNs that were needed, meds refused, or side effects to meds)- Trazodone  50 mg, Afrin   (Any disturbances and when (visitation, over night)- N/A  (Concerns raised by the patient)- N/A  (SI/HI/AVH)-denies

## 2024-01-30 NOTE — Progress Notes (Signed)
   01/30/24 9187  Psych Admission Type (Psych Patients Only)  Admission Status Involuntary  Psychosocial Assessment  Patient Complaints Anxiety  Eye Contact Brief  Facial Expression Sad  Affect Sad  Speech Logical/coherent  Interaction Assertive  Motor Activity Slow  Appearance/Hygiene Unremarkable  Behavior Characteristics Appropriate to situation  Mood Anxious;Preoccupied  Thought Process  Coherency Blocking;Circumstantial  Content Preoccupation  Delusions None reported or observed  Perception WDL  Hallucination None reported or observed  Judgment Impaired  Confusion None  Danger to Self  Current suicidal ideation? Denies  Danger to Others  Danger to Others None reported or observed

## 2024-01-30 NOTE — Group Note (Unsigned)
 Date:  01/30/2024 Time:  7:49 PM  Group Topic/Focus:  Wrap-Up Group:   The focus of this group is to help patients review their daily goal of treatment and discuss progress on daily workbooks.     Participation Level:  {B[facility] PARTICIPATION OZCZO:77735}  Participation Quality:  {[facility] PARTICIPATION QUALITY:22265}  Affect:  {[facility] AFFECT:22266}  Cognitive:  {[facility] COGNITIVE:22267}  Insight: {BHH Insight2:20797}  Engagement in Group:  {B[facility] ENGAGEMENT IN HMNLE:77731}  Modes of Intervention:  {B[facility] MODES OF INTERVENTION:22269}  Additional Comments:  ***  Joyce Cruz [place] 01/30/2024, 7:49 PM

## 2024-01-30 NOTE — [facility] Suicide Risk Assessment (Addendum)
 [facility] INPATIENT:  Family/Significant Other Suicide Prevention Education  Suicide Prevention Education:  Contact Attempts: [name] (friend) [phone], (name of family member/significant other) has been identified by the patient as the family member/significant other with whom the patient will be residing, and identified as the person(s) who will aid the patient in the event of a mental health crisis.  Date and time of first attempt: 01/30/2024 / 1:25 PM  The phone didn't ring.  The message said, I'm sorry but the person you have called has a voicemail box that hasn't been set up yet.   Phung Kotas O Dalissa Lovin, LCSWA 01/30/2024, 1:26 PM

## 2024-01-30 NOTE — [facility] Group Notes (Signed)
 Adult Psychoeducational Group Note  Date:  01/30/2024 Time:  10:05 AM  Group Topic/Focus:  Goals Group:   The focus of this group is to help patients establish daily goals to achieve during treatment and discuss how the patient can incorporate goal setting into their daily lives to aide in recovery.  Participation Level:  Active  Participation Quality:  Appropriate  Affect:  Appropriate  Cognitive:  Appropriate  Insight: Appropriate  Engagement in Group:  Engaged  Modes of Intervention:  Discussion  Additional Comments:THe patient engaged in improving the moment group.  Joyce Cruz 01/30/2024, 10:05 AM

## 2024-01-31 DIAGNOSIS — F19959 Other psychoactive substance use, unspecified with psychoactive substance-induced psychotic disorder, unspecified: Principal | ICD-10-CM

## 2024-01-31 MED ORDER — RISPERIDONE 1 MG PO TABS: 1.0000 mg | ORAL_TABLET | Freq: Every day | ORAL | 0 refills | Status: AC

## 2024-01-31 NOTE — Progress Notes (Signed)
   01/31/24 1500  Psychosocial Assessment  Patient Complaints None  Eye Contact Brief  Facial Expression Sad;Blank  Affect Appropriate to circumstance  Speech Soft;Logical/coherent  Interaction Assertive  Motor Activity Other (Comment) (wnl)  Appearance/Hygiene Unremarkable  Behavior Characteristics Cooperative;Appropriate to situation  Mood Pleasant  Thought Process  Coherency Blocking  Content WDL  Delusions None reported or observed  Perception WDL  Hallucination None reported or observed  Judgment WDL  Confusion None  Danger to Self  Current suicidal ideation? Denies  Danger to Others  Danger to Others None reported or observed

## 2024-01-31 NOTE — Progress Notes (Signed)
 Upon discharging, Nurse went over AVS educating on medications and follow up appointments. Suicide safety plan was completed. Reviewed with nurse and copy given to patient and placed in chart. Belongings sheet was signed and items returned to patient. Patient denies SI/HI (with no plan) and AVH. Voices no concerns to staff prior discharging off unit. Was safely walked out ride.

## 2024-01-31 NOTE — BHH Suicide Risk Assessment (Signed)
 [facility] Discharge Suicide Risk Assessment   Principal Problem: Psychoactive substance-induced psychosis (HCC) Discharge Diagnoses: Principal Problem:   Psychoactive substance-induced psychosis (HCC) Active Problems:   Psychosis, unspecified psychosis type (HCC)  Joyce Cruz is a 23 y.o. female with no past psychiatric history, IVC initiated 8/6/ due to concern for psychosis and bizarre behavior. She was evaluated by the consult psychiatrist who recommended inpatient stabilization due to unspecified psychosis. She was transferred to [facility] for medication management and symptom stabilization.   During the patient's hospitalization, patient had extensive initial psychiatric evaluation, and follow-up psychiatric evaluations every day.   Psychiatric diagnoses provided upon initial assessment:  Diagnosis:  Principal Problem: #Psychosis, unspecified psychosis type (HCC)  # Marijuana Use  # R/o substance Induced Psychosis  # R/o Generalized Anxiety  # R/o Schizophrenia vs Bipolar Disorder    Patient's psychiatric medications were adjusted on admission:  - Increased Risperal 1 mg q12hours, started at 1 mg daily by consultant psychiatrist in ED     During the hospitalization, other adjustments were made to the patient's psychiatric medication regimen:  - Ativan  1 mg three times daily, due to concern for catatonia was discontinued prior to discharge  - Decreased Risperdal  1 mg at bedtime prior to discharge, outpatient provider f/u appointment scheduled to consider tapering off    Patient's care was discussed during the interdisciplinary team meeting every day during the hospitalization.   The patient denied  having side effects to prescribed psychiatric medication.   Gradually, patient started adjusting to milieu. The patient was evaluated each day by a clinical provider to ascertain response to treatment. Improvement was noted by the patient's report of decreasing  symptoms, improved sleep and appetite, affect, medication tolerance, behavior, and participation in unit programming.  Patient was asked each day to complete a self inventory noting mood, mental status, pain, new symptoms, anxiety and concerns.     Symptoms were reported as significantly decreased or resolved completely by discharge.    On day of discharge, the patient reports that their mood is stable. The patient denied having suicidal thoughts for more than 48 hours prior to discharge.  Patient denies having homicidal thoughts.  Patient denies having auditory hallucinations.  Patient denies any visual hallucinations or other symptoms of psychosis. The patient was motivated to continue taking medication with a goal of continued improvement in mental health.    The patient reports their target psychiatric symptoms of paranoia, disorganization  and hallucinations responded well to the psychiatric medications, and the patient reports overall benefit other psychiatric hospitalization. Supportive psychotherapy was provided to the patient. The patient also participated in regular group therapy while hospitalized. Coping skills, problem solving as well as relaxation therapies were also part of the unit programming.   Labs were reviewed with the patient, and abnormal results were discussed with the patient.   The patient is able to verbalize their individual safety plan to this provider.   # It is recommended to the patient to continue psychiatric medications as prescribed, after discharge from the hospital.     # It is recommended to the patient to follow up with your outpatient psychiatric provider and PCP.   # It was discussed with the patient, the impact of alcohol, drugs, tobacco have been there overall psychiatric and medical wellbeing, and total abstinence from substance use was recommended the patient.ed.   # Prescriptions provided or sent directly to preferred pharmacy at discharge. Patient  agreeable to plan. Given opportunity to ask questions.  Appears to feel comfortable with discharge.    # In the event of worsening symptoms, the patient is instructed to call the crisis hotline, 911 and or go to the nearest ED for appropriate evaluation and treatment of symptoms. To follow-up with primary care provider for other medical issues, concerns and or health care needs   # Patient was discharged to self care with a plan to follow up as noted below.   Total Time spent with patient: 30 minutes  Musculoskeletal: Strength & Muscle Tone: within normal limits Gait & Station: normal Patient leans: N/A  Psychiatric Specialty Exam  Presentation  General Appearance:  Appropriate for Environment; Casual; Fairly Groomed  Eye Contact: Good  Speech: Clear and Coherent; Normal Rate  Speech Volume: Normal  Handedness:No data recorded  Mood and Affect  Mood: Euthymic  Duration of Depression Symptoms: Denies Affect: Appropriate; Full Range   Thought Process  Thought Processes: Coherent  Descriptions of Associations:Intact  Orientation:Full (Time, Place and Person)  Thought Content:Logical  History of Schizophrenia/Schizoaffective disorder:No  Duration of Psychotic Symptoms:Less than six months  Hallucinations:Hallucinations: None  Ideas of Reference:None  Suicidal Thoughts:Suicidal Thoughts: No  Homicidal Thoughts:Homicidal Thoughts: No   Sensorium  Memory: Immediate Good; Recent Good  Judgment: Good  Insight: Good   Executive Functions  Concentration: Good  Attention Span: Good  Recall: Good  Fund of Knowledge: Good  Language: Good   Psychomotor Activity  Psychomotor Activity: Psychomotor Activity: Normal   Assets  Assets: Resilience; Vocational/Educational; Social Support   Sleep  Sleep: Sleep: Good  Estimated Sleeping Duration (Last 24 Hours): 7.25-7.50 hours  Physical Exam: Physical Exam Constitutional:       General: She is not in acute distress.    Appearance: She is not ill-appearing, toxic-appearing or diaphoretic.  Eyes:     Conjunctiva/sclera: Conjunctivae normal.  Pulmonary:     Effort: Pulmonary effort is normal.  Musculoskeletal:        General: Normal range of motion.  Neurological:     Mental Status: She is alert and oriented to person, place, and time.     Review of Systems  Constitutional:  Negative for chills and fever.  HENT:  Positive for congestion.   Respiratory:  Negative for cough.   Gastrointestinal:  Negative for nausea and vomiting.  Psychiatric/Behavioral:  Negative for depression, hallucinations, substance abuse and suicidal ideas. The patient is not nervous/anxious and does not have insomnia.   Blood pressure 127/80, pulse (!) 132, temperature 98 F (36.7 C), resp. rate 15, height 5' 2 (1.575 m), weight 43.8 kg, last menstrual period 01/19/2024, SpO2 100%. Body mass index is 17.65 kg/m.  Mental Status Per Nursing Assessment::   On Admission:  NA  Demographic Factors:  Low socioeconomic status and Unemployed  Loss Factors: Decrease in vocational status and Financial problems/change in socioeconomic status  Historical Factors: NA  Risk Reduction Factors:   Sense of responsibility to family, Religious beliefs about death, and Positive social support  Continued Clinical Symptoms:  Alcohol/Substance Abuse/Dependencies, patient states she abstain from further marijuana use   Cognitive Features That Contribute To Risk:  None    Suicide Risk:  Minimal: No identifiable suicidal ideation.  Patients presenting with no risk factors but with morbid ruminations; may be classified as minimal risk based on the severity of the depressive symptoms   Follow-up Information     [facility] Go on 02/06/2024.   Specialty: Professional Counselor Why: Please go to this provider on 02/06/24 at 9:00  am for an assessment, to obtain therapy services.  You  may also go on Monday through Friday, from 9 am to 1 pm, for an initial assessment. Contact information: [address]  [address] [place] [address] [phone]         [facility] Go on 02/08/2024.   Specialty: Behavioral Health Why: Please go to this provider on 02/08/24 at 7:00 am for an assessment, to obtain medication management services. You may also go on Monday through Friday, arrive by 7:00 am for an initial assessment. Contact information: [address] [place]  [id number] [phone]                Plan Of Care/Follow-up recommendations:   Activity: as tolerated   Diet: heart healthy   Other: -Follow-up with your outpatient psychiatric provider -instructions on appointment date, time, and address (location) are provided to you in discharge paperwork.   -Take your psychiatric medications as prescribed at discharge - instructions are provided to you in the discharge paperwork   -Follow-up with outpatient primary care doctor and other specialists -for management of preventative medicine and chronic medical disease none    -Testing: Follow-up with outpatient provider for abnormal lab results:  - elevated cholesterol 251 and LDL 145  - Cr 1.04    -If you are prescribed an atypical antipsychotic medication, we recommend that your outpatient psychiatrist follow routine screening for side effects within 3 months of discharge, including monitoring: AIMS scale, height, weight, blood pressure, fasting lipid panel, HbA1c, and fasting blood sugar.    -Recommend total abstinence from alcohol, tobacco, and other illicit drug use at discharge.    -If your psychiatric symptoms recur, worsen, or if you have side effects to your psychiatric medications, call your outpatient psychiatric provider, 911, [phone] or go to the nearest emergency department.   -If suicidal thoughts occur, immediately call your outpatient psychiatric provider,  911, [phone] or go to the nearest emergency department.  PATTI OLDEN, MD 01/31/2024, 8:40 AM

## 2024-01-31 NOTE — Plan of Care (Signed)
 Problem: Education: Goal: Knowledge of [facility] Education information/materials will improve Outcome: Adequate for Discharge Goal: Emotional status will improve Outcome: Adequate for Discharge Goal: Mental status will improve Outcome: Adequate for Discharge Goal: Verbalization of understanding the information provided will improve Outcome: Adequate for Discharge   Problem: Activity: Goal: Interest or engagement in activities will improve Outcome: Adequate for Discharge Goal: Sleeping patterns will improve Outcome: Adequate for Discharge   Problem: Coping: Goal: Ability to verbalize frustrations and anger appropriately will improve Outcome: Adequate for Discharge Goal: Ability to demonstrate self-control will improve Outcome: Adequate for Discharge   Problem: Health Behavior/Discharge Planning: Goal: Identification of resources available to assist in meeting health care needs will improve Outcome: Adequate for Discharge Goal: Compliance with treatment plan for underlying cause of condition will improve Outcome: Adequate for Discharge   Problem: Physical Regulation: Goal: Ability to maintain clinical measurements within normal limits will improve Outcome: Adequate for Discharge   Problem: Safety: Goal: Periods of time without injury will increase Outcome: Adequate for Discharge   Problem: Education: Goal: Knowledge of [facility] Education information/materials will improve Outcome: Adequate for Discharge Goal: Emotional status will improve Outcome: Adequate for Discharge Goal: Mental status will improve Outcome: Adequate for Discharge Goal: Verbalization of understanding the information provided will improve Outcome: Adequate for Discharge   Problem: Activity: Goal: Interest or engagement in activities will improve Outcome: Adequate for Discharge Goal: Sleeping patterns will improve Outcome: Adequate for Discharge   Problem: Coping: Goal:  Ability to verbalize frustrations and anger appropriately will improve Outcome: Adequate for Discharge Goal: Ability to demonstrate self-control will improve Outcome: Adequate for Discharge   Problem: Health Behavior/Discharge Planning: Goal: Identification of resources available to assist in meeting health care needs will improve Outcome: Adequate for Discharge Goal: Compliance with treatment plan for underlying cause of condition will improve Outcome: Adequate for Discharge   Problem: Physical Regulation: Goal: Ability to maintain clinical measurements within normal limits will improve Outcome: Adequate for Discharge   Problem: Safety: Goal: Periods of time without injury will increase Outcome: Adequate for Discharge   Problem: Education: Goal: Utilization of techniques to improve thought processes will improve Outcome: Adequate for Discharge Goal: Knowledge of the prescribed therapeutic regimen will improve Outcome: Adequate for Discharge   Problem: Activity: Goal: Interest or engagement in leisure activities will improve Outcome: Adequate for Discharge Goal: Imbalance in normal sleep/wake cycle will improve Outcome: Adequate for Discharge   Problem: Coping: Goal: Coping ability will improve Outcome: Adequate for Discharge Goal: Will verbalize feelings Outcome: Adequate for Discharge   Problem: Health Behavior/Discharge Planning: Goal: Ability to make decisions will improve Outcome: Adequate for Discharge Goal: Compliance with therapeutic regimen will improve Outcome: Adequate for Discharge   Problem: Role Relationship: Goal: Will demonstrate positive changes in social behaviors and relationships Outcome: Adequate for Discharge   Problem: Safety: Goal: Ability to disclose and discuss suicidal ideas will improve Outcome: Adequate for Discharge Goal: Ability to identify and utilize support systems that promote safety will improve Outcome: Adequate for Discharge    Problem: Self-Concept: Goal: Will verbalize positive feelings about self Outcome: Adequate for Discharge Goal: Level of anxiety will decrease Outcome: Adequate for Discharge   Problem: Activity: Goal: Will identify at least one activity in which they can participate Outcome: Adequate for Discharge   Problem: Coping: Goal: Ability to identify and develop effective coping behavior will improve Outcome: Adequate for Discharge Goal: Ability to interact with others will improve Outcome: Adequate for Discharge Goal: Demonstration of  participation in decision-making regarding own care will improve Outcome: Adequate for Discharge Goal: Ability to use eye contact when communicating with others will improve Outcome: Adequate for Discharge   Problem: Health Behavior/Discharge Planning: Goal: Identification of resources available to assist in meeting health care needs will improve Outcome: Adequate for Discharge   Problem: Self-Concept: Goal: Will verbalize positive feelings about self Outcome: Adequate for Discharge   Problem: Activity: Goal: Will verbalize the importance of balancing activity with adequate rest periods Outcome: Adequate for Discharge   Problem: Education: Goal: Will be free of psychotic symptoms Outcome: Adequate for Discharge Goal: Knowledge of the prescribed therapeutic regimen will improve Outcome: Adequate for Discharge   Problem: Coping: Goal: Coping ability will improve Outcome: Adequate for Discharge Goal: Will verbalize feelings Outcome: Adequate for Discharge

## 2024-01-31 NOTE — Group Note (Unsigned)
 Date:  01/31/2024 Time:  3:27 AM  Group Topic/Focus:  Wrap-Up Group:   The focus of this group is to help patients review their daily goal of treatment and discuss progress on daily workbooks.     Participation Level:  {BHH PARTICIPATION OZCZO:77735}  Participation Quality:  {BHH PARTICIPATION QUALITY:22265}  Affect:  {BHH AFFECT:22266}  Cognitive:  {BHH COGNITIVE:22267}  Insight: {BHH Insight2:20797}  Engagement in Group:  {BHH ENGAGEMENT IN HMNLE:77731}  Modes of Intervention:  {BHH MODES OF INTERVENTION:22269}  Additional Comments:  ***  Lonni Na 01/31/2024, 3:27 AM

## 2024-01-31 NOTE — Discharge Instructions (Signed)

## 2024-01-31 NOTE — Group Note (Deleted)
 Date:  01/31/2024 Time:  2:22 PM  Group Topic/Focus:  Wellness Toolbox:   The focus of this group is to discuss various aspects of wellness, balancing those aspects and exploring ways to increase the ability to experience wellness.  Patients will create a wellness toolbox for use upon discharge.     Participation Level:  {BHH PARTICIPATION OZCZO:77735}  Participation Quality:  {BHH PARTICIPATION QUALITY:22265}  Affect:  {BHH AFFECT:22266}  Cognitive:  {BHH COGNITIVE:22267}  Insight: {BHH Insight2:20797}  Engagement in Group:  {BHH ENGAGEMENT IN HMNLE:77731}  Modes of Intervention:  {BHH MODES OF INTERVENTION:22269}  Additional Comments:  ***  Myra Curtistine BROCKS 01/31/2024, 2:22 PM

## 2024-01-31 NOTE — Group Note (Signed)
 Date:  01/31/2024 Time:  9:08 AM  Group Topic/Focus:  Goals Group:   The focus of this group is to help patients establish daily goals to achieve during treatment and discuss how the patient can incorporate goal setting into their daily lives to aide in recovery.    Participation Level:  Active  Participation Quality:  Appropriate  Affect:  Appropriate  Cognitive:  Appropriate  Insight: Appropriate  Engagement in Group:  Improving  Modes of Intervention:  Discussion   Joyce Cruz A [name] 01/31/2024, 9:08 AM

## 2024-01-31 NOTE — Progress Notes (Addendum)
  [facility] Adult Case Management Discharge Plan :  Will you be returning to the same living situation after discharge:  No.  Patient will go to her boyfriend's (ex-boyfriend's) home, [name], upon discharge At discharge, do you have transportation home?: Yes,  patient's boyfriend, Silvia Font (ex-boyfriend) [phone] will pick her up at 4 PM Do you have the ability to pay for your medications: Yes,  patient has insurance  Release of information consent forms completed and in the chart;  Patient's signature needed at discharge.  Patient to Follow up at:  Follow-up Information     [facility] Go on 02/06/2024.   Specialty: Professional Counselor Why: Please go to this provider on 02/06/24 at 9:00 am for an assessment, to obtain therapy services.  You may also go on Monday through Friday, from 9 am to 1 pm, for an initial assessment. Contact information: [address]  [address] [place] [address] [phone]         [facility] Go on 02/08/2024.   Specialty: Behavioral Health Why: Please go to this provider on 02/08/24 at 7:00 am for an assessment, to obtain medication management services. You may also go on Monday through Friday, arrive by 7:00 am for an initial assessment. Contact information: [address] [place]  [id number] [phone]                Next level of care provider has access to [facility] Link:no  Safety Planning and Suicide Prevention discussed: Yes,  Silvia (ex-boyfriend) [phone] and Mariane (friend) [phone]  Has patient been referred to the Quitline?: Patient does not use tobacco/nicotine products  Patient has been referred for addiction treatment: At admission, patient tested positive for marijuana.  Patient believes marijuana was laced with something, and will no longer use it.  She said she used only occasionally, so quitting shouldn't be difficult.  She said she  will go to a therapist, but will not need therapist's help with quitting.   Arabia Nylund O Huntington Leverich, LCSWA 01/31/2024, 12:55 PM
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
